# Patient Record
Sex: Male | Born: 1950
Health system: Southern US, Community
[De-identification: ages and names within clinical notes are randomized; demographics above are authoritative.]

## PROBLEM LIST (undated history)

## (undated) DIAGNOSIS — J449 Chronic obstructive pulmonary disease, unspecified: Secondary | ICD-10-CM

## (undated) DIAGNOSIS — M1990 Unspecified osteoarthritis, unspecified site: Secondary | ICD-10-CM

## (undated) DIAGNOSIS — I10 Essential (primary) hypertension: Secondary | ICD-10-CM

---

## 2011-05-28 ENCOUNTER — Inpatient Hospital Stay (INDEPENDENT_AMBULATORY_CARE_PROVIDER_SITE_OTHER)
Admission: RE | Admit: 2011-05-28 | Discharge: 2011-05-28 | Disposition: A | Discharge status: Home / Self Care | Payer: Self-pay | Source: Ambulatory Visit | Attending: Family Medicine | Admitting: Family Medicine

## 2011-05-28 ENCOUNTER — Ambulatory Visit (INDEPENDENT_AMBULATORY_CARE_PROVIDER_SITE_OTHER): Payer: Self-pay

## 2011-05-28 DIAGNOSIS — J45909 Unspecified asthma, uncomplicated: Secondary | ICD-10-CM

## 2011-05-28 DIAGNOSIS — J449 Chronic obstructive pulmonary disease, unspecified: Secondary | ICD-10-CM

## 2015-07-23 ENCOUNTER — Emergency Department (HOSPITAL_COMMUNITY)
Admission: EM | Admit: 2015-07-23 | Discharge: 2015-07-23 | Disposition: A | Discharge status: Other Acute Inpatient Hospital | Payer: PPO | Source: Home / Self Care | Attending: Family Medicine | Admitting: Family Medicine

## 2015-07-23 ENCOUNTER — Encounter (HOSPITAL_COMMUNITY): Payer: Self-pay | Admitting: *Deleted

## 2015-07-23 ENCOUNTER — Emergency Department (HOSPITAL_COMMUNITY): Payer: PPO

## 2015-07-23 ENCOUNTER — Emergency Department (HOSPITAL_COMMUNITY)
Admission: EM | Admit: 2015-07-23 | Discharge: 2015-07-23 | Disposition: A | Discharge status: Home / Self Care | Payer: PPO | Attending: Emergency Medicine | Admitting: Emergency Medicine

## 2015-07-23 DIAGNOSIS — Z8739 Personal history of other diseases of the musculoskeletal system and connective tissue: Secondary | ICD-10-CM | POA: Insufficient documentation

## 2015-07-23 DIAGNOSIS — Z79899 Other long term (current) drug therapy: Secondary | ICD-10-CM | POA: Insufficient documentation

## 2015-07-23 DIAGNOSIS — I10 Essential (primary) hypertension: Secondary | ICD-10-CM

## 2015-07-23 HISTORY — DX: Essential (primary) hypertension: I10

## 2015-07-23 HISTORY — DX: Unspecified osteoarthritis, unspecified site: M19.90

## 2015-07-23 LAB — CBC WITH DIFFERENTIAL/PLATELET
BASOS ABS: 0 10*3/uL (ref 0.0–0.1)
Basophils Relative: 0 %
Eosinophils Absolute: 0.2 10*3/uL (ref 0.0–0.7)
Eosinophils Relative: 3 %
HEMATOCRIT: 41.6 % (ref 39.0–52.0)
Hemoglobin: 14.4 g/dL (ref 13.0–17.0)
LYMPHS PCT: 19 %
Lymphs Abs: 1.6 10*3/uL (ref 0.7–4.0)
MCH: 30.1 pg (ref 26.0–34.0)
MCHC: 34.6 g/dL (ref 30.0–36.0)
MCV: 87 fL (ref 78.0–100.0)
MONO ABS: 0.6 10*3/uL (ref 0.1–1.0)
MONOS PCT: 8 %
NEUTROS ABS: 5.7 10*3/uL (ref 1.7–7.7)
Neutrophils Relative %: 70 %
Platelets: 228 10*3/uL (ref 150–400)
RBC: 4.78 MIL/uL (ref 4.22–5.81)
RDW: 12.6 % (ref 11.5–15.5)
WBC: 8.1 10*3/uL (ref 4.0–10.5)

## 2015-07-23 LAB — COMPREHENSIVE METABOLIC PANEL
ALT: 28 U/L (ref 17–63)
ANION GAP: 10 (ref 5–15)
AST: 22 U/L (ref 15–41)
Albumin: 4 g/dL (ref 3.5–5.0)
Alkaline Phosphatase: 125 U/L (ref 38–126)
BILIRUBIN TOTAL: 0.4 mg/dL (ref 0.3–1.2)
BUN: 7 mg/dL (ref 6–20)
CHLORIDE: 104 mmol/L (ref 101–111)
CO2: 24 mmol/L (ref 22–32)
Calcium: 8.9 mg/dL (ref 8.9–10.3)
Creatinine, Ser: 0.99 mg/dL (ref 0.61–1.24)
GFR calc Af Amer: >60 mL/min (ref >60)
GFR calc non Af Amer: >60 mL/min (ref >60)
GLUCOSE: 107 mg/dL — AB (ref 65–99)
POTASSIUM: 3.6 mmol/L (ref 3.5–5.1)
Sodium: 138 mmol/L (ref 135–145)
Total Protein: 7 g/dL (ref 6.5–8.1)

## 2015-07-23 MED ORDER — CLONIDINE HCL 0.1 MG PO TABS
ORAL_TABLET | ORAL | Status: AC
  Filled 2015-07-23: qty 1

## 2015-07-23 MED ORDER — LISINOPRIL 10 MG PO TABS
10.0000 mg | ORAL_TABLET | Freq: Once | ORAL | Status: AC
  Administered 2015-07-23: 10 mg via ORAL
  Filled 2015-07-23: qty 1

## 2015-07-23 MED ORDER — CLONIDINE HCL 0.1 MG PO TABS
0.1000 mg | ORAL_TABLET | Freq: Once | ORAL | Status: AC
  Administered 2015-07-23: 0.1 mg via ORAL

## 2015-07-23 MED ORDER — LISINOPRIL-HYDROCHLOROTHIAZIDE 10-12.5 MG PO TABS: 1.0000 | ORAL_TABLET | Freq: Every day | ORAL | Status: AC

## 2015-07-23 NOTE — ED Notes (Signed)
[facility] and is non-complaint with meds. BP at office 235/136. [facility] Arrived by shuttle.

## 2015-07-23 NOTE — ED Notes (Signed)
The pt last took bp med 20 years ago.  He is also c/o some lt hand numbness for one hour  He reports that he has this off and on he has arthritis

## 2015-07-23 NOTE — Discharge Instructions (Signed)
You have been seen today for high blood pressure. Your lab tests showed no abnormalities. Follow up with PCP as soon as possible for chronic management of this issue. You may select a PCP from the list below or choose any other provider. Return to ED should you feel dizzy, nausea, chest pain, shortness of breath, or any weakness/numbness/tingling, or any other major concerns.   Emergency Department Resource Guide 1) Find a Doctor and Pay Out of Pocket Although you won't have to find out who is covered by your insurance plan, it is a good idea to ask around and get recommendations. You will then need to call the office and see if the doctor you have chosen will accept you as a new patient and what types of options they offer for patients who are self-pay. Some doctors offer discounts or will set up payment plans for their patients who do not have insurance, but you will need to ask so you aren't surprised when you get to your appointment.  2) Contact Your Local Health Department Not all health departments have doctors that can see patients for sick visits, but many do, so it is worth a call to see if yours does. If you don't know where your local health department is, you can check in your phone book. The CDC also has a tool to help you locate your state's health department, and many state websites also have listings of all of their local health departments.  3) Find a Walk-in Clinic If your illness is not likely to be very severe or complicated, you may want to try a walk in clinic. These are popping up all over the country in pharmacies, drugstores, and shopping centers. They're usually staffed by nurse practitioners or physician assistants that have been trained to treat common illnesses and complaints. They're usually fairly quick and inexpensive. However, if you have serious medical issues or chronic medical problems, these are probably not your best option.  No Primary Care Doctor: - Call Health  Connect at  [phone] - they can help you locate a primary care doctor that  accepts your insurance, provides certain services, etc. - Physician Referral Service- [phone]  Chronic Pain Problems: Organization         Address  Phone   Notes  [facility]  [phone] Patients need to be referred by their primary care doctor.   Medication Assistance: Organization         Address  Phone   Notes  [facility] Medication [facility] [address] [place], [place] [address] [phone] --Must be a resident of [facility] [place] -- Must have NO insurance coverage whatsoever (no Medicaid/ Medicare, etc.) -- The pt. MUST have a primary care doctor that directs their care regularly and follows them in the community   MedAssist  [phone]   [employer]  [phone]    Agencies that provide inexpensive medical care: Organization         Address  Phone   Notes  [facility]  [phone]   [facility]    [phone]   [facility] [address] [place], [place] [address] [phone]   [facility] [address], [place] [phone]   Planned Parenthood    [phone]   [facility]    [phone]   [facility]  [address],  [place] Phone:  [phone], Fax:  [phone] Hours of Operation:  9 am - 6 pm, M-F.  Also accepts Medicaid/Medicare and self-pay.  [facility] for [place], [address], [place] Phone: [phone], Fax: [phone]. Hours of Operation:  8:30 am - 5:30 pm, M-F.  Also accepts Medicaid and self-pay.  [facility] [address], [place] Phone: [phone]   [place], [place], [place] [phone], Ext. [phone] Mondays & Thursdays: 7-9 AM.  First 15 patients are seen on a first come, first serve basis.     [place] Providers:  Organization         Address  Phone   Notes  [facility] [address], [place] [phone] Also accepts self-pay patients.  [facility] [address], [place]  [phone]   [place], [address], [place] [phone]   [facility] [address], [place] [phone]   Lucianne Lei [address], [place]   [phone] Only accepts [place] Access [place] patients after they have their name applied to their card.   Self-Pay (no insurance) in [facility]:  Organization         Address  Phone   Notes  Sickle Cell Patients, [facility] [place] [phone]   [facility] [place] [phone]   [facility] [place]  [place], [place], [place] [phone]   [facility]/Dr. Osei-Bonsu  [address], [place] or [address], [place] [phone] Phone number for both [place] and [place] locations is the same.  [facility] [address] [place] [phone]   [facility] [address], [place] or [address] [phone]   [facility] [address], [place] [phone], phone; [phone], fax Sees patients 1st and 3rd Saturday of every month.  Must not qualify for public or private insurance (i.e. Medicaid, Medicare, [place] Health Choice, Veterans' Benefits)  Household income should be no more than 200% of the poverty level The clinic cannot treat you if you are pregnant or think you are pregnant  Sexually transmitted diseases are not treated at the clinic.    Dental Care: Organization         Address  Phone  Notes  [facility]  Department of [facility] [phone] Accepts children up to age 40 who are enrolled in [place] or [place]; pregnant women with a Medicaid card; and children who have applied for Medicaid or [place] Health Choice, but were declined, whose parents can pay a reduced fee at time of service.  [facility] of [facility]  [address], [place] [phone] Accepts children up to age 58 who are enrolled in [place] or [place]; pregnant women with a Medicaid card; and children who have applied for Medicaid or [place] Health Choice, but were declined, whose parents can pay a reduced fee at time of service.  [facility] Adult [facility] Access PROGRAM  [place] [phone] Patients are seen by  appointment only. Walk-ins are not accepted. [place] will see patients 41 years of age and older. Monday - Tuesday (8am-5pm) Most Wednesdays (8:30-5pm) $30 per visit, cash only  [facility] Adult Dental Access PROGRAM  [address], [facility] [phone] Patients are seen by appointment only. Walk-ins are not accepted. [place] will see patients 34 years of age and older. One Wednesday Evening (Monthly: Volunteer Based).  $30 per visit, cash only  [place]  [phone] for adults; Children under age 21, call Graduate Pediatric Dentistry at [phone]. Children aged 56-14, please call [phone] to request a pediatric application.  Dental services are provided in all areas of dental care including fillings, crowns and bridges, complete and partial dentures, implants, gum treatment, root canals, and extractions. Preventive care is also provided. Treatment is provided to both adults and children. Patients are selected via a lottery and there is often a waiting list.   [facility] [address] [place]  [phone]  [email or web address]   [facility] [address] [place], [place] [phone], Ext. [phone] Second and Fourth Thursday of each month, opens at 6:30 AM; Clinic ends at 9 AM.  Patients are seen on a first-come first-served basis, and a limited number are seen during each clinic.   [facility]  [address] [place], [place] [phone]   Eligibility Requirements You must have lived in [place], [place], or [place] counties for at least the last three months.   You cannot be eligible for state or federal sponsored [employer], including [employer], [place], or [employer].   You generally cannot be eligible for healthcare insurance through your employer.    How to apply: Eligibility screenings are held every Tuesday and Wednesday afternoon from 1:00 pm until 4:00 pm. You do not need an appointment for the interview!  [facility] [address] [place], [place]   [place]  [place] Department  [place]  [phone]    Behavioral Health Resources in the Community: Intensive Outpatient Programs Organization         Address  Phone  Notes  [facility] [place]. [address] [place], [place] [phone]   [facility] [address] [place], [place]   ADS: Alcohol & Drug Svcs [address], [place], [place]   [place] [address],  [place], [place] or [phone]   Substance Abuse Resources Organization         Address  Phone  Notes  Alcohol and Drug Services  [phone]   [place]  [phone]   The [place]   Chinita Pester  [phone]   Residential & Outpatient Substance Abuse Program  [phone]   Psychological Services Organization          Address  Phone  Notes  [facility]  [place]  [phone]   [place] [address] [place] [phone] or [phone]    Mobile Crisis Teams Organization         Address  Phone  Notes  [facility], Mobile Crisis Care Unit  [phone]   [facility] Psychotherapeutic [facility]  [address] [place], [place]   [facility] [address], [place] [phone]  Self-Help/Support Groups Organization         Address  Phone             Notes  [facility]ental Health Assoc[facility] of [place] - variety of support groups  [place] Call for more information  Narcotics Anonymous (NA), [facility]aring [facility] [address], [employer] [place]  2 meetings at this location   Special educational needs teacher         Address  Phone  Notes  ASAP Residential Treatment [place],    [place]  [phone]   [facility]  [address] [place] [phone], [place], [place]   [place], [place] [phone] Admissions: 8am-3pm M-F  Incentives Substance [place] [address]    [place], [place] [phone]   [facility] [address] [place], [place], [place]   [facility] [address]  [place], [place]   [facility]ight Programs - Intensive Outpatient [address], [place], [place]   [facility] ([place] [address].,  [place], [place] [phone] or [phone]   [facility]idential Treatment Services (RTS) [address] [place], [place] Accepts Medicaid  [facility] [place] [address]  [place] [phone] Substance Abuse/Addiction Treatment   [facility] Organization         Address  Phone  Notes  [facility]  [phone]   Domenic Schwab, PhD [address] Arlis [place], [place]   [phone] or [phone]   [place], [place] [phone]   [facility] [address] [place], [place] [phone] Insurance/Medicaid/sponsorship through [facility] and Families [address], [address] [place]                                    [place], [place] [phone] [place] [address], [place] [phone]    Dr. Adele Schilder  [phone]   [facility] Dept. 1) [address]  2) [place] 3)  [address], [place] [phone]  [phone]   [place] [phone] or [phone] (After Hours)

## 2015-07-23 NOTE — ED Notes (Signed)
Pt  States     He  Has  No symptoms     - he  States  He  Was  Told  His  Blood  Pressure    Was  High  Today   While  At a  Health   Fair         he  States  A  History  Of  Hypertension  But is  On no meds    He  Has  No PCP

## 2015-07-23 NOTE — ED Provider Notes (Signed)
CSN: [id number]     Arrival date & time 07/23/15  1551 History   First MD Initiated Contact with Patient 07/23/15 1639     Chief Complaint  Patient presents with  . Hypertension     (Consider location/radiation/quality/duration/timing/severity/associated sxs/prior Treatment) HPI   Dylan Howell is a 64 y.o. male, with a distant history of HTN, presenting to the ED with hypertension that was found incidentially at a health fair. Pt was on BP medication over 20 years ago, but doesn't remember what he was taking. Pt denies any symptoms associated with the HTN. Pt does not have a PCP. Pt denies chest pain, shortness of breath, headache, dizziness, neuro deficits, or any other complaints. Triage note states pt has left hand numbness. Pt denies any numbness/tingling/weakness now or previously.   Past Medical History  Diagnosis Date  . Hypertension   . Arthritis    History reviewed. No pertinent past surgical history. No family history on file. Social History  Substance Use Topics  . Smoking status: Never Smoker   . Smokeless tobacco: None  . Alcohol Use: No    Review of Systems  Cardiovascular:       Hypertension  All other systems reviewed and are negative.     Allergies  Review of patient's allergies indicates no known allergies.  Home Medications   Prior to Admission medications   Medication Sig Start Date End Date Taking? Authorizing Provider  guaiFENesin (MUCINEX) 600 MG 12 hr tablet Take by mouth 2 (two) times daily.   Yes Historical Provider, MD  OVER THE COUNTER MEDICATION Take 1 tablet by mouth 2 (two) times daily as needed (breathing).   Yes Historical Provider, MD  lisinopril-hydrochlorothiazide (ZESTORETIC) 10-12.5 MG tablet Take 1 tablet by mouth daily. 07/23/15   Yasuo Phimmasone C Jameeka Marcy, PA-C   BP 185/119 mmHg  Pulse 94  Temp(Src) 99.1 F (37.3 C) (Oral)  Resp 17  Ht  (1.702 m)  Wt 75.099 kg  BMI 25.92 kg/m2  SpO2 98% Physical Exam  Constitutional: He is  oriented to person, place, and time. He appears well-developed and well-nourished. No distress.  HENT:  Head: Normocephalic and atraumatic.  Eyes: Conjunctivae and EOM are normal. Pupils are equal, round, and reactive to light.  Neck: Normal range of motion. Neck supple.  Cardiovascular: Normal rate, regular rhythm, normal heart sounds and intact distal pulses.   Pulmonary/Chest: Effort normal and breath sounds normal. No respiratory distress.  Abdominal: Soft. Bowel sounds are normal.  Musculoskeletal: He exhibits no edema or tenderness.  Lymphadenopathy:    He has no cervical adenopathy.  Neurological: He is alert and oriented to person, place, and time. He has normal reflexes.  No sensory deficits. Strength 5/5 in all extremities. No gait disturbance. Cranial nerves III-XII grossly intact. No facial droop.  Skin: Skin is warm and dry. He is not diaphoretic.  Nursing note and vitals reviewed.   ED Course  Procedures (including critical care time) Labs Review Labs Reviewed  COMPREHENSIVE METABOLIC PANEL - Abnormal; Notable for the following:    Glucose, Bld 107 (*)    All other components within normal limits  CBC WITH DIFFERENTIAL/PLATELET  URINALYSIS, ROUTINE W REFLEX MICROSCOPIC (NOT AT [facility])    Imaging Review No results found. I have personally reviewed and evaluated these images and lab results as part of my medical decision-making.   EKG Interpretation   Date/Time:  Thursday July 23 2015 16:00:50 EST Ventricular Rate:  92 PR Interval:  144 QRS Duration: 146 QT  Interval:  402 QTC Calculation: 497 R Axis:   -27 Text Interpretation:  Normal sinus rhythm Right bundle branch block Septal  infarct , age undetermined Abnormal ECG No old tracing to compare  Confirmed by GOLDSTON  MD, SCOTT ([id number]) on 07/23/2015 5:14:08 PM      MDM   Final diagnoses:  Essential hypertension    Dylan Howell presents with high blood pressure for an unknown duration. Patient is  asymptomatic.  Findings and plan of care discussed with Pricilla LovelessScott Goldston, MD.  This patient is asymptomatic to his hypertension and is quite possible that he has been living with this hypertension for some time. Patient's blood pressure reduced somewhat with clonidine prior to arrival. Patient to receive prescription for combination lisinopril and HCTZ due to the extreme nature of his hypertension today. Patient also given instructions to select and follow-up with a PCP as soon as possible for chronic management of this issue. Patient was given the plan of care as well as return precautions. Patient voices understanding of these instructions, accepts the plan, and is comfortable with discharge.  Filed Vitals:   07/23/15 1700 07/23/15 1715 07/23/15 1742 07/23/15 1743  BP: 185/119 186/115 172/124   Pulse: 94 91 88 90  Temp:      TempSrc:      Resp: 17 16 18    Height:      Weight:      SpO2: 98% 98% 97% 98%     Anselm PancoastShawn C Jessi Pitstick, PA-C 07/24/15 0128  Pricilla LovelessScott Goldston, MD 07/24/15 2321

## 2015-07-23 NOTE — ED Notes (Signed)
The pt was at a health fair and his bp was elevated he is here to get it checked out

## 2015-07-23 NOTE — ED Provider Notes (Signed)
CSN: [id number]     Arrival date & time 07/23/15  1447 History   First MD Initiated Contact with Patient 07/23/15 1515     Chief Complaint  Patient presents with  . Hypertension   (Consider location/radiation/quality/duration/timing/severity/associated sxs/prior Treatment) Patient is a 64 y.o. male presenting with hypertension. The history is provided by the patient.  Hypertension This is a new problem. The current episode started 3 to 5 hours ago (at a clinic this am and bp 200s/110s, told to come here, denies sx.). The problem has not changed since onset.Pertinent negatives include no chest pain, no abdominal pain, no headaches and no shortness of breath.    Past Medical History  Diagnosis Date  . Hypertension   . Arthritis    History reviewed. No pertinent past surgical history. History reviewed. No pertinent family history. Social History  Substance Use Topics  . Smoking status: Never Smoker   . Smokeless tobacco: None  . Alcohol Use: No    Review of Systems  Constitutional: Negative.   Respiratory: Negative.  Negative for shortness of breath.   Cardiovascular: Negative.  Negative for chest pain, palpitations and leg swelling.  Gastrointestinal: Negative for abdominal pain.  Neurological: Negative for headaches.  All other systems reviewed and are negative.   Allergies  Review of patient's allergies indicates no known allergies.  Home Medications   Prior to Admission medications   Not on File   Meds Ordered and Administered this Visit   Medications  cloNIDine (CATAPRES) tablet 0.1 mg (not administered)    BP 235/136 mmHg  Pulse 104  Temp(Src) 98.4 F (36.9 C) (Oral)  Resp 18  SpO2 98% No data found.   Physical Exam  Constitutional: He is oriented to person, place, and time. He appears well-developed and well-nourished.  Neck: Normal range of motion. Neck supple.  Cardiovascular: Regular rhythm, normal heart sounds, intact distal pulses and normal  pulses.  Tachycardia present.   Pulmonary/Chest: Effort normal and breath sounds normal.  Musculoskeletal: He exhibits no edema.  Lymphadenopathy:    He has no cervical adenopathy.  Neurological: He is alert and oriented to person, place, and time.  Skin: Skin is warm and dry.  Nursing note and vitals reviewed.   ED Course  Procedures (including critical care time)  Labs Review Labs Reviewed - No data to display  Imaging Review No results found.   Visual Acuity Review  Right Eye Distance:   Left Eye Distance:   Bilateral Distance:    Right Eye Near:   Left Eye Near:    Bilateral Near:         MDM   1. Malignant hypertension    Sent for eval and mnmnt of malignant hbp but without sx. Found similar readings this am at a clinic. Also tachycardic.    Linna HoffJames D Siah Kannan, MD 07/23/15 [phone]

## 2015-10-26 DIAGNOSIS — I10 Essential (primary) hypertension: Secondary | ICD-10-CM | POA: Diagnosis not present

## 2015-11-16 DIAGNOSIS — Z0000 Encounter for general adult medical examination without abnormal findings: Secondary | ICD-10-CM | POA: Diagnosis not present

## 2015-11-16 DIAGNOSIS — I10 Essential (primary) hypertension: Secondary | ICD-10-CM | POA: Diagnosis not present

## 2015-12-07 DIAGNOSIS — I10 Essential (primary) hypertension: Secondary | ICD-10-CM | POA: Diagnosis not present

## 2016-03-01 DIAGNOSIS — I10 Essential (primary) hypertension: Secondary | ICD-10-CM | POA: Diagnosis not present

## 2016-05-30 DIAGNOSIS — E785 Hyperlipidemia, unspecified: Secondary | ICD-10-CM | POA: Diagnosis not present

## 2016-05-30 DIAGNOSIS — I10 Essential (primary) hypertension: Secondary | ICD-10-CM | POA: Diagnosis not present

## 2016-07-04 DIAGNOSIS — I10 Essential (primary) hypertension: Secondary | ICD-10-CM | POA: Diagnosis not present

## 2016-12-19 DIAGNOSIS — I10 Essential (primary) hypertension: Secondary | ICD-10-CM | POA: Diagnosis not present

## 2017-12-11 DIAGNOSIS — Z1211 Encounter for screening for malignant neoplasm of colon: Secondary | ICD-10-CM | POA: Diagnosis not present

## 2017-12-11 DIAGNOSIS — I10 Essential (primary) hypertension: Secondary | ICD-10-CM | POA: Diagnosis not present

## 2018-03-04 ENCOUNTER — Emergency Department (HOSPITAL_COMMUNITY)
Admission: EM | Admit: 2018-03-04 | Discharge: 2018-03-04 | Disposition: A | Discharge status: Home / Self Care | Payer: PPO | Attending: Emergency Medicine | Admitting: Emergency Medicine

## 2018-03-04 ENCOUNTER — Encounter (HOSPITAL_COMMUNITY): Payer: Self-pay | Admitting: Emergency Medicine

## 2018-03-04 DIAGNOSIS — S6992XA Unspecified injury of left wrist, hand and finger(s), initial encounter: Secondary | ICD-10-CM | POA: Diagnosis present

## 2018-03-04 DIAGNOSIS — S61012A Laceration without foreign body of left thumb without damage to nail, initial encounter: Secondary | ICD-10-CM | POA: Diagnosis not present

## 2018-03-04 DIAGNOSIS — Z23 Encounter for immunization: Secondary | ICD-10-CM | POA: Diagnosis not present

## 2018-03-04 DIAGNOSIS — W260XXA Contact with knife, initial encounter: Secondary | ICD-10-CM | POA: Insufficient documentation

## 2018-03-04 DIAGNOSIS — I10 Essential (primary) hypertension: Secondary | ICD-10-CM | POA: Insufficient documentation

## 2018-03-04 DIAGNOSIS — Y929 Unspecified place or not applicable: Secondary | ICD-10-CM | POA: Diagnosis not present

## 2018-03-04 DIAGNOSIS — Y93G1 Activity, food preparation and clean up: Secondary | ICD-10-CM | POA: Diagnosis not present

## 2018-03-04 DIAGNOSIS — Z79899 Other long term (current) drug therapy: Secondary | ICD-10-CM | POA: Insufficient documentation

## 2018-03-04 DIAGNOSIS — Y999 Unspecified external cause status: Secondary | ICD-10-CM | POA: Insufficient documentation

## 2018-03-04 MED ORDER — TETANUS-DIPHTH-ACELL PERTUSSIS 5-2.5-18.5 LF-MCG/0.5 IM SUSP
0.5000 mL | Freq: Once | INTRAMUSCULAR | Status: AC
  Administered 2018-03-04: 0.5 mL via INTRAMUSCULAR
  Filled 2018-03-04: qty 0.5

## 2018-03-04 NOTE — ED Triage Notes (Signed)
Pt presents with L thumb lac after using butcher knife; bleeding controlled in triage

## 2018-03-04 NOTE — ED Provider Notes (Signed)
[facility]  CSN: [id number] Arrival date & time: 03/04/18  1913  History   Chief Complaint Chief Complaint  Patient presents with  . Laceration    HPI Dylan Howell is a 67 y.o. male with a medical history of arthritis and HTN who presented to the ED for thumb laceration. He reports cutting his thumb on a knife while cutting cantaloupe. Bleeding controlled prior to arrival. Patient is not on anticoagulant. Denies paresthesias, weakness, color or temperature change. He states he is still able to move the thumb.  Past Medical History:  Diagnosis Date  . Arthritis   . Hypertension     There are no active problems to display for this patient.   History reviewed. No pertinent surgical history.      Home Medications    Prior to Admission medications   Medication Sig Start Date End Date Taking? Authorizing Provider  guaiFENesin (MUCINEX) 600 MG 12 hr tablet Take by mouth 2 (two) times daily.    [provider]  lisinopril-hydrochlorothiazide (ZESTORETIC) 10-12.5 MG tablet Take 1 tablet by mouth daily. 07/23/15   Joy, Shawn C, PA-C  OVER THE COUNTER MEDICATION Take 1 tablet by mouth 2 (two) times daily as needed (breathing).    [provider]    Family History History reviewed. No pertinent family history.  Social History Social History   Tobacco Use  . Smoking status: Never Smoker  Substance Use Topics  . Alcohol use: No  . Drug use: Not on file     Allergies   Patient has no known allergies.   Review of Systems Review of Systems  Constitutional: Negative.   Musculoskeletal: Negative.   Skin: Positive for wound. Negative for color change.  Neurological: Negative for weakness and numbness.  Hematological: Does not bruise/bleed easily.     Physical Exam Updated Vital Signs BP (!) 147/85 (BP Location: Right Arm)   Pulse 80   Temp 98.9 F (37.2 C) (Oral)   Resp 18   Ht 5\' 6"  (1.676  m)   Wt 74.4 kg (164 lb)   SpO2 100%   BMI 26.47 kg/m   Physical Exam  Constitutional: He appears well-developed and well-nourished.  Cardiovascular:  Pulses:      Radial pulses are 2+ on the right side, and 2+ on the left side.  Musculoskeletal:       Left hand: He exhibits laceration. He exhibits normal range of motion, no tenderness, no bony tenderness and normal capillary refill. Normal sensation noted. Normal strength noted.       Hands: Full ROM of hands bilaterally with 5/5 strength. 2 cm superficial laceration on distal aspect of left thumb. Not actively bleeding.  Skin: Skin is warm. Capillary refill takes less than 2 seconds. Laceration noted.  Nursing note and vitals reviewed.    ED Treatments / Results  Labs (all labs ordered are listed, but only abnormal results are displayed) Labs Reviewed - No data to display  EKG None  Radiology No results found.  Procedures .Marland Kitchen.Laceration Repair Date/Time: 03/04/2018 8:45 PM Performed by: Windy CarinaMortis, Gabrielle I, PA-C Authorized by: Windy CarinaMortis, Gabrielle I, PA-C   Consent:    Consent obtained:  Verbal   Consent given by:  Patient   Risks discussed:  Infection, pain, poor cosmetic result and need for additional repair   Alternatives discussed:  No treatment Anesthesia (see MAR for exact dosages):    Anesthesia method:  None Laceration details:    Location:  Finger   Finger location:  L thumb   Length (cm):  2   Depth (mm):  1 Repair type:    Repair type:  Simple Exploration:    Hemostasis achieved with:  Direct pressure   Wound exploration: wound explored through full range of motion and entire depth of wound probed and visualized   Treatment:    Area cleansed with:  Soap and water Skin repair:    Repair method:  Tissue adhesive Approximation:    Approximation:  Close Post-procedure details:    Patient tolerance of procedure:  Tolerated well, no immediate complications Comments:     Superficial wound 2cm x 1mm  repaired with Dermabond   (including critical care time)  Medications Ordered in ED Medications  Tdap (BOOSTRIX) injection 0.5 mL (0.5 mLs Intramuscular Given 03/04/18 2016)     Initial Impression / Assessment and Plan / ED Course  Triage vital signs and the nursing notes have been reviewed.  Pertinent labs & imaging results that were available during care of the patient were reviewed and considered in medical decision making (see chart for details).   Patient presents with thumb laceration. There is no tendon or vascular damage as he has normal distal pulses, capillary refill and ROM. Laceration not deep enough for sutures and was appropriately repaired with Dermabond today. Tdap booster given.  Final Clinical Impressions(s) / ED Diagnoses  1. Left Thumb Laceration. Repaired with Dermabond. Education provided on wound care, follow-up and s/s of infection. Tdap given in the ED.  Dispo: Home. After thorough clinical evaluation, this patient is determined to be medically stable and can be safely discharged with the previously mentioned treatment and/or outpatient follow-up/referral(s). At this time, there are no other apparent medical conditions that require further screening, evaluation or treatment.   Final diagnoses:  Laceration of left thumb without foreign body without damage to nail, initial encounter    ED Discharge Orders    None        Reva Bores 03/04/18 2047    Melene Plan, DO 03/04/18 2336

## 2018-03-04 NOTE — Discharge Instructions (Addendum)
Follow-up with a medical provider if you have one or more of the following symptoms: fever; increased redness, warmth or tenderness at the wound site; pain in joints beyond where the initial wound was; unusual discharge.  Enjoy your cantaloupe!

## 2018-03-04 NOTE — ED Notes (Signed)
Small cut to his lt thumb  Minimal bleeding  Washed with soap and water

## 2018-07-19 ENCOUNTER — Encounter (HOSPITAL_COMMUNITY): Payer: Self-pay | Admitting: Emergency Medicine

## 2018-07-19 ENCOUNTER — Ambulatory Visit (HOSPITAL_COMMUNITY)
Admission: EM | Admit: 2018-07-19 | Discharge: 2018-07-19 | Disposition: A | Discharge status: Home / Self Care | Payer: PPO | Attending: Family Medicine | Admitting: Family Medicine

## 2018-07-19 ENCOUNTER — Ambulatory Visit (INDEPENDENT_AMBULATORY_CARE_PROVIDER_SITE_OTHER): Payer: PPO

## 2018-07-19 ENCOUNTER — Other Ambulatory Visit: Payer: Self-pay

## 2018-07-19 DIAGNOSIS — M25422 Effusion, left elbow: Secondary | ICD-10-CM

## 2018-07-19 DIAGNOSIS — M7989 Other specified soft tissue disorders: Secondary | ICD-10-CM | POA: Diagnosis not present

## 2018-07-19 DIAGNOSIS — S59902A Unspecified injury of left elbow, initial encounter: Secondary | ICD-10-CM | POA: Diagnosis not present

## 2018-07-19 DIAGNOSIS — M25522 Pain in left elbow: Secondary | ICD-10-CM | POA: Diagnosis not present

## 2018-07-19 DIAGNOSIS — M7022 Olecranon bursitis, left elbow: Secondary | ICD-10-CM

## 2018-07-19 HISTORY — DX: Chronic obstructive pulmonary disease, unspecified: J44.9

## 2018-07-19 NOTE — ED Triage Notes (Addendum)
Pt fell on his left elbow about one week ago.  Pt has a swollen pocket of fluid on his left elbow.  Pt denies any pain.  Pt is on a few BP medications but he does not know the names of them.  He has had one of them today and he will take the other one tonight.

## 2018-07-19 NOTE — ED Provider Notes (Signed)
[facility]    CSN: [id number] Arrival date & time: 07/19/18  1149     History   Chief Complaint Chief Complaint  Patient presents with  . Elbow Injury    left    HPI Dylan Howell is a 67 y.o. male.   Patient is a 67 year old male presents for left elbow swelling.  He had a fall approximately 1 week ago landing on the left elbow. It was slightly painful at that time but he had full range of motion.  He is currently not in  any pain and continues to have full range of motion.  There is swelling just above the olecranon.  He has had no fevers, chills, body aches.  ROS per HPI      Past Medical History:  Diagnosis Date  . COPD (chronic obstructive pulmonary disease) (HCC)   . Hypertension     There are no active problems to display for this patient.   History reviewed. No pertinent surgical history.     Home Medications    Prior to Admission medications   Not on File    Family History No family history on file.  Social History Social History   Tobacco Use  . Smoking status: Former Smoker    Types: Cigarettes    Last attempt to quit: 07/19/2009    Years since quitting: 9.0  . Smokeless tobacco: Never Used  Substance Use Topics  . Alcohol use: Never    Frequency: Never  . Drug use: Never     Allergies   Patient has no known allergies.   Review of Systems Review of Systems   Physical Exam Triage Vital Signs ED Triage Vitals  Enc Vitals Group     BP 07/19/18 1256 (!) 157/101     Pulse Rate 07/19/18 1256 (!) 101     Resp 07/19/18 1256 18     Temp 07/19/18 1256 98 F (36.7 C)     Temp Source 07/19/18 1256 Oral     SpO2 07/19/18 1256 97 %     Weight --      Height --      Head Circumference --      Peak Flow --      Pain Score 07/19/18 1303 0     Pain Loc --      Pain Edu? --      Excl. in GC? --    No data found.  Updated Vital Signs BP (!) 157/101 (BP Location: Right Arm)   Pulse (!) 101   Temp 98 F (36.7 C)  (Oral)   Resp 18   SpO2 97%   Visual Acuity Right Eye Distance:   Left Eye Distance:   Bilateral Distance:    Right Eye Near:   Left Eye Near:    Bilateral Near:     Physical Exam Vitals signs and nursing note reviewed.  Constitutional:      Appearance: Normal appearance.  HENT:     Head: Normocephalic and atraumatic.     Nose: Nose normal.  Neck:     Musculoskeletal: Normal range of motion.  Pulmonary:     Effort: Pulmonary effort is normal.  Musculoskeletal: Normal range of motion.        General: Swelling and signs of injury present. No tenderness or deformity.     Comments: Pocket of fluid noted over the left olecranon process  posterior. Non tender to touch. No erythema or increased warmth. Good ROM  Skin:  General: Skin is warm and dry.  Neurological:     Mental Status: He is alert.  Psychiatric:        Mood and Affect: Mood normal.      UC Treatments / Results  Labs (all labs ordered are listed, but only abnormal results are displayed) Labs Reviewed - No data to display  EKG None  Radiology Dg Elbow Complete Left  Result Date: 07/19/2018 CLINICAL DATA:  Pain and swelling of the elbow, fell over week ago EXAM: LEFT ELBOW - COMPLETE 3+ VIEW COMPARISON:  None. FINDINGS: No acute fracture is seen. Alignment is normal. No joint effusion is noted. However, there is soft tissue swelling over the olecranon. These findings are consistent with either olecranon bursitis or olecranon hematoma. IMPRESSION: Soft tissue swelling of the left carotid consistent with olecranon bursitis or hematoma. No fracture. Electronically Signed   By: Dwyane DeePaul  Barry M.D.   On: 07/19/2018 13:49    Procedures Join Aspiration/Injection Date/Time: 07/19/2018 2:59 PM Performed by: Janace ArisBast, Aldwin Micalizzi A, NP Authorized by: Janace ArisBast, Thaddaeus Granja A, NP   Consent:    Consent obtained:  Verbal   Consent given by:  Patient   Risks discussed:  Bleeding, infection, pain and incomplete drainage   Alternatives  discussed:  No treatment Location:    Location:  Elbow   Elbow:  L elbow Anesthesia (see MAR for exact dosages):    Anesthesia method:  Topical application   Topical anesthesia: freeze spray. Procedure details:    Needle gauge:  20 G   Ultrasound guidance: no     Approach:  Inferior   Aspirate amount:  20 cc   Aspirate characteristics:  Blood-tinged and serous   Steroid injected: no     Specimen collected: no   Post-procedure details:    Dressing:  Adhesive bandage   Patient tolerance of procedure:  Tolerated well, no immediate complications   (including critical care time)  Medications Ordered in UC Medications - No data to display  Initial Impression / Assessment and Plan / UC Course  I have reviewed the triage vital signs and the nursing notes.  Pertinent labs & imaging results that were available during my care of the patient were reviewed by me and considered in my medical decision making (see chart for details).     X-ray negative for any fractures Aspirated 20 cc of bloody synovial fluid from left elbow Wrapped snugly with CoBan and instructed to wear for the next 24 hours Instructed that the fluid could reaccumulate. No signs or symptoms of infection Follow up as needed for continued or worsening symptoms  Final Clinical Impressions(s) / UC Diagnoses   Final diagnoses:  Olecranon bursitis of left elbow     Discharge Instructions     We removed 20 mL of fluid from your elbow today. Keep the Coban wrap on for the next 24 hours Make sure you are wrapping daily for the next couple of days Follow up as needed for continued or worsening symptoms     ED Prescriptions    None     Controlled Substance Prescriptions  Controlled Substance Registry consulted? Not Applicable   Janace ArisBast, Virjean Boman A, NP 07/19/18 1501

## 2018-07-19 NOTE — Discharge Instructions (Addendum)
We removed 20 mL of fluid from your elbow today. Keep the Coban wrap on for the next 24 hours Make sure you are wrapping daily for the next couple of days Follow up as needed for continued or worsening symptoms

## 2018-07-20 ENCOUNTER — Encounter (HOSPITAL_COMMUNITY): Payer: Self-pay | Admitting: Emergency Medicine

## 2018-07-30 DIAGNOSIS — M25522 Pain in left elbow: Secondary | ICD-10-CM | POA: Diagnosis not present

## 2018-07-30 DIAGNOSIS — L0390 Cellulitis, unspecified: Secondary | ICD-10-CM | POA: Diagnosis not present

## 2018-08-07 DIAGNOSIS — M7150 Other bursitis, not elsewhere classified, unspecified site: Secondary | ICD-10-CM | POA: Diagnosis not present

## 2018-09-05 DIAGNOSIS — I10 Essential (primary) hypertension: Secondary | ICD-10-CM | POA: Diagnosis not present

## 2018-09-05 DIAGNOSIS — Z1211 Encounter for screening for malignant neoplasm of colon: Secondary | ICD-10-CM | POA: Diagnosis not present

## 2018-09-05 DIAGNOSIS — R740 Nonspecific elevation of levels of transaminase and lactic acid dehydrogenase [LDH]: Secondary | ICD-10-CM | POA: Diagnosis not present

## 2018-09-05 DIAGNOSIS — D649 Anemia, unspecified: Secondary | ICD-10-CM | POA: Diagnosis not present

## 2019-03-13 DIAGNOSIS — I10 Essential (primary) hypertension: Secondary | ICD-10-CM | POA: Diagnosis not present

## 2019-03-13 DIAGNOSIS — R945 Abnormal results of liver function studies: Secondary | ICD-10-CM | POA: Diagnosis not present

## 2019-08-27 DIAGNOSIS — R209 Unspecified disturbances of skin sensation: Secondary | ICD-10-CM | POA: Diagnosis not present

## 2019-08-27 DIAGNOSIS — I10 Essential (primary) hypertension: Secondary | ICD-10-CM | POA: Diagnosis not present

## 2019-08-27 DIAGNOSIS — E785 Hyperlipidemia, unspecified: Secondary | ICD-10-CM | POA: Diagnosis not present

## 2019-08-27 DIAGNOSIS — Z1211 Encounter for screening for malignant neoplasm of colon: Secondary | ICD-10-CM | POA: Diagnosis not present

## 2019-08-27 DIAGNOSIS — Z0000 Encounter for general adult medical examination without abnormal findings: Secondary | ICD-10-CM | POA: Diagnosis not present

## 2020-04-01 DIAGNOSIS — I10 Essential (primary) hypertension: Secondary | ICD-10-CM | POA: Diagnosis not present

## 2020-04-01 DIAGNOSIS — N189 Chronic kidney disease, unspecified: Secondary | ICD-10-CM | POA: Diagnosis not present

## 2020-05-25 DIAGNOSIS — Z23 Encounter for immunization: Secondary | ICD-10-CM | POA: Diagnosis not present

## 2020-09-03 DIAGNOSIS — E785 Hyperlipidemia, unspecified: Secondary | ICD-10-CM | POA: Diagnosis not present

## 2020-09-03 DIAGNOSIS — Z0000 Encounter for general adult medical examination without abnormal findings: Secondary | ICD-10-CM | POA: Diagnosis not present

## 2020-09-03 DIAGNOSIS — I10 Essential (primary) hypertension: Secondary | ICD-10-CM | POA: Diagnosis not present

## 2020-09-04 DIAGNOSIS — Z0000 Encounter for general adult medical examination without abnormal findings: Secondary | ICD-10-CM | POA: Diagnosis not present

## 2020-09-04 DIAGNOSIS — Z1211 Encounter for screening for malignant neoplasm of colon: Secondary | ICD-10-CM | POA: Diagnosis not present

## 2020-11-19 IMAGING — DX DG ELBOW COMPLETE 3+V*L*
4 series · 4 of 4 positions shown · non-contrast
Comparison: None.

CLINICAL DATA: Pain and swelling of the elbow, fell over week ago

EXAM:
LEFT ELBOW - COMPLETE 3+ VIEW

[elbow ap (1 of 2)]
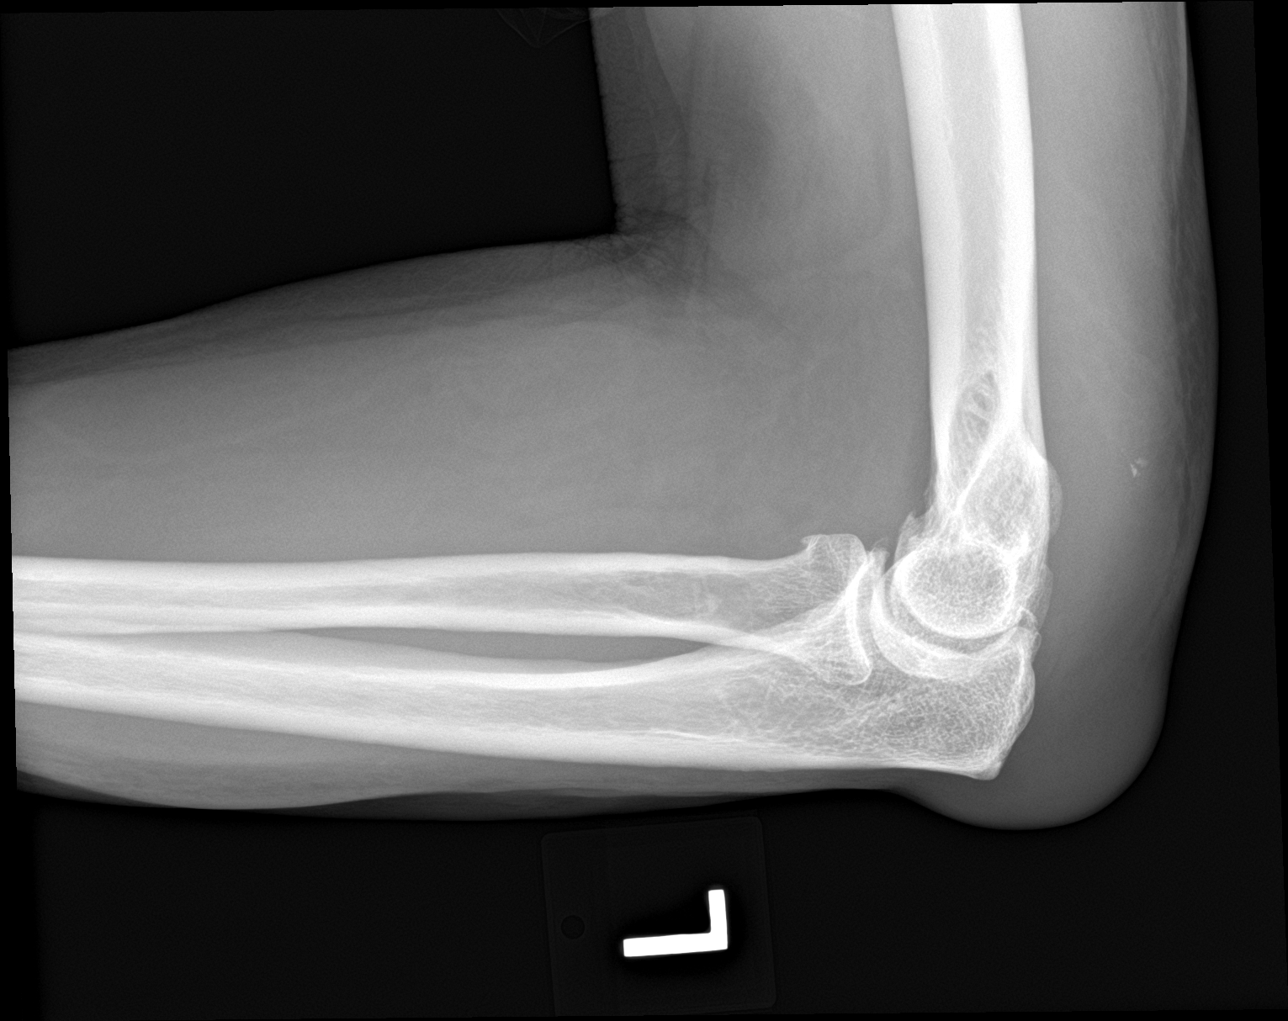

[elbow obl (1 of 2)]
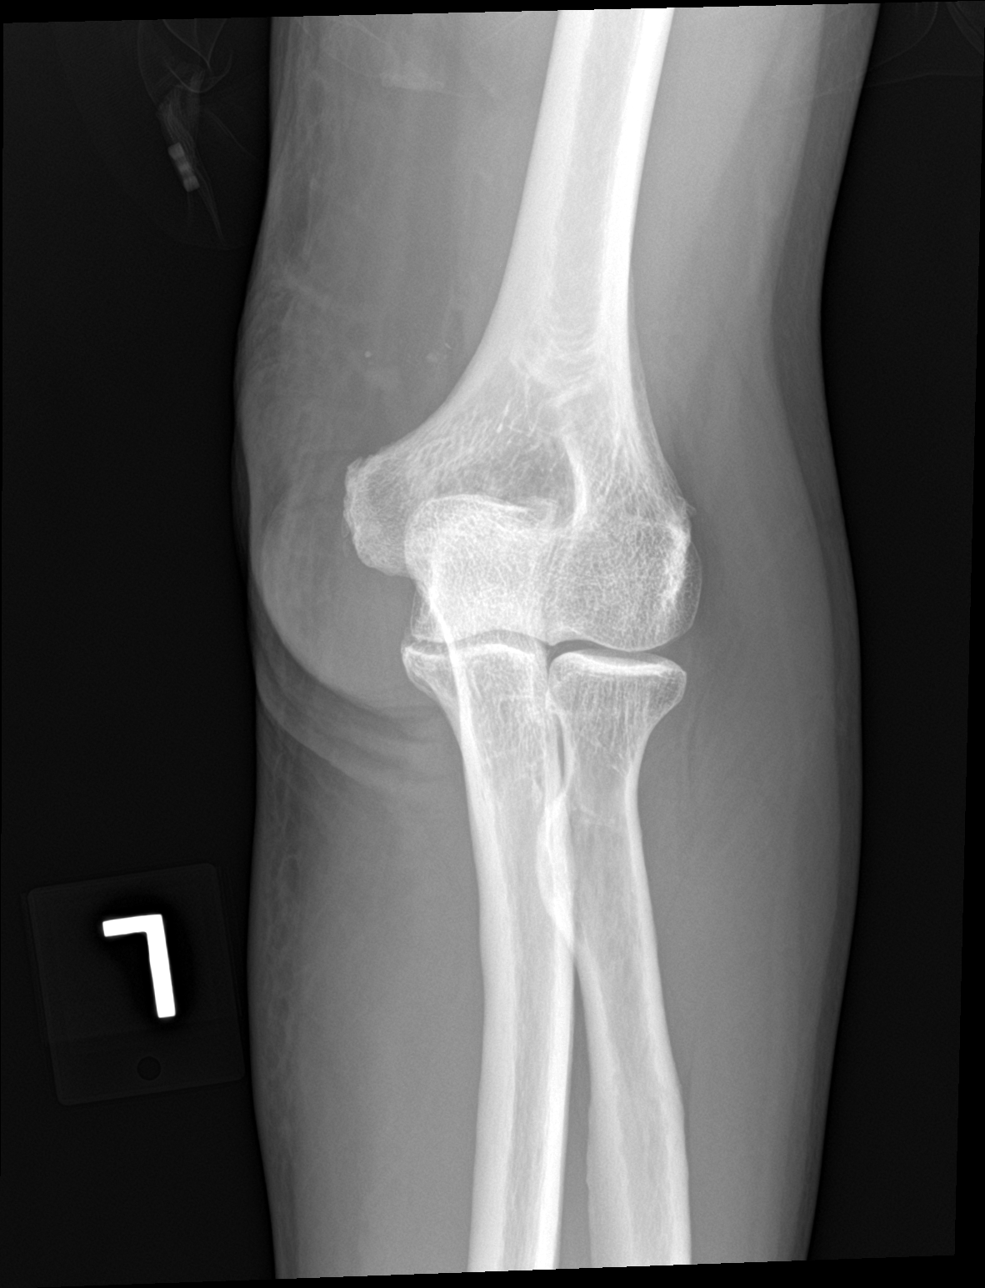

[elbow obl (2 of 2)]
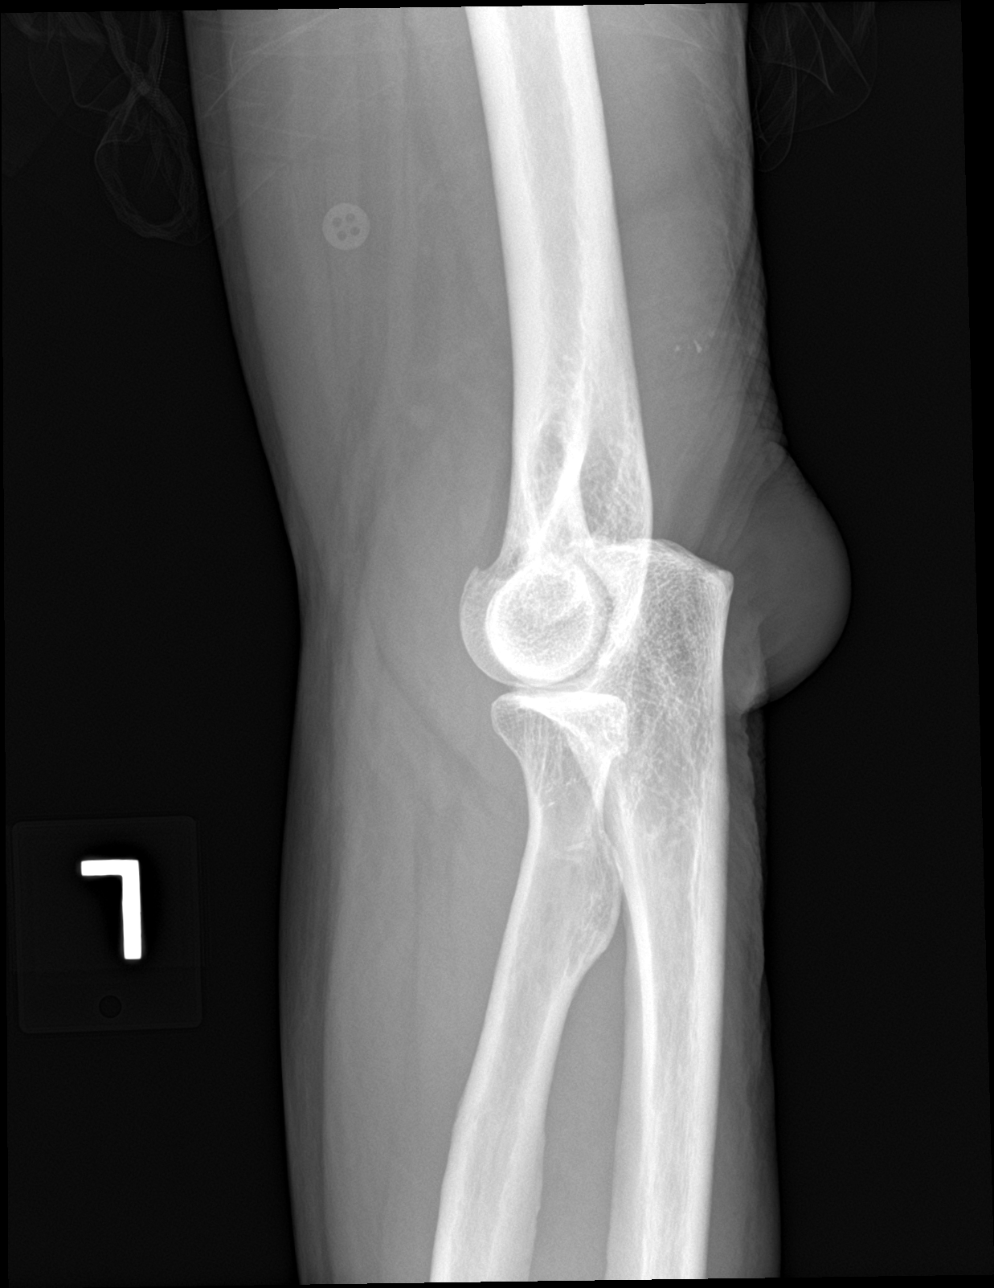

[elbow ap (2 of 2)]
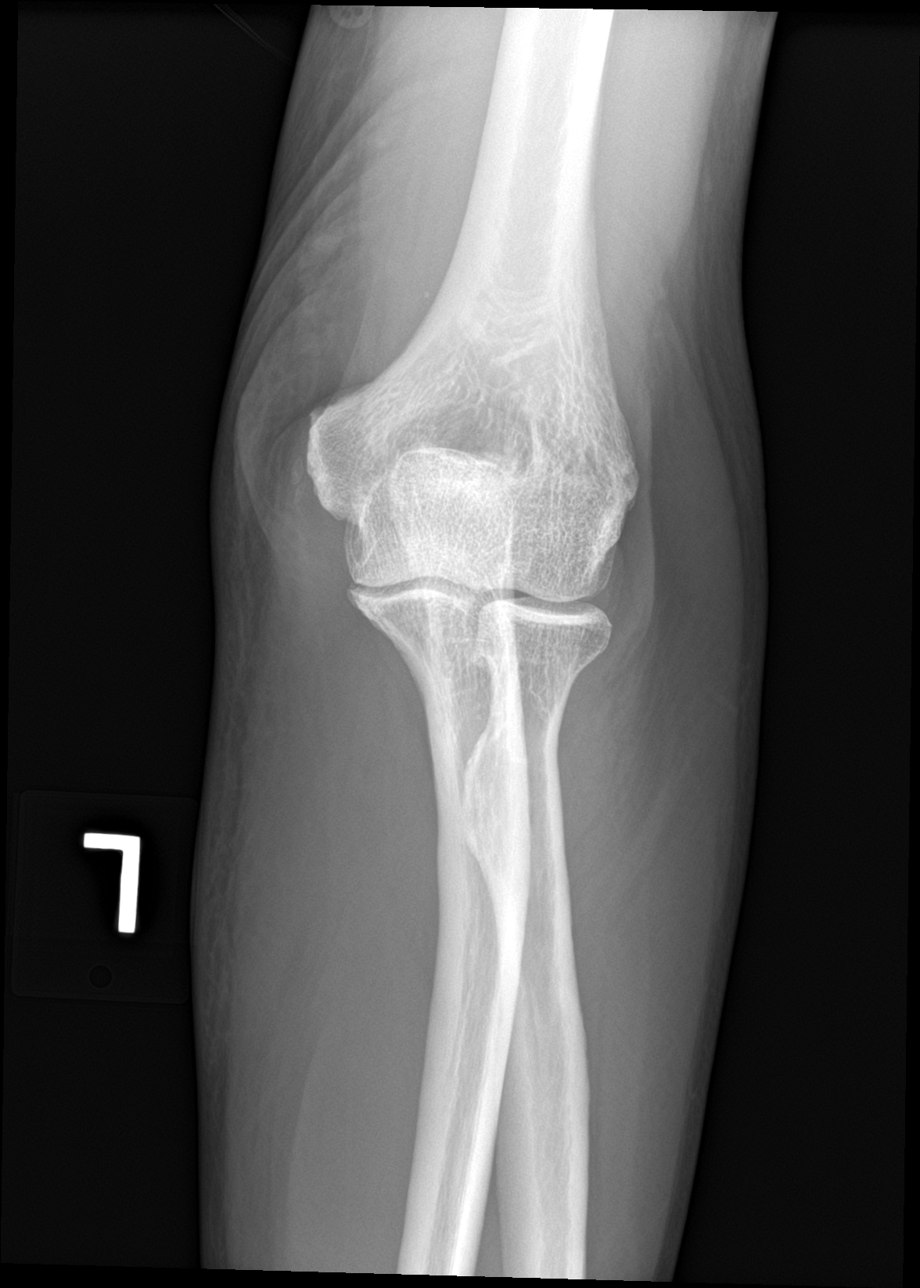

[4 of 4 positions shown; findings below may reference images not displayed]

FINDINGS: No acute fracture is seen. Alignment is normal. No joint effusion is
noted. However, there is soft tissue swelling over the olecranon.
These findings are consistent with either olecranon bursitis or
olecranon hematoma.
IMPRESSION: Soft tissue swelling of the left carotid consistent with olecranon
bursitis or hematoma. No fracture.

## 2021-05-05 DIAGNOSIS — Z23 Encounter for immunization: Secondary | ICD-10-CM | POA: Diagnosis not present
